# Patient Record
Sex: Male | Born: 1970 | Race: Black or African American | Hispanic: No | Smoking: Former smoker
Health system: Southern US, Community
[De-identification: ages and names within clinical notes are randomized; demographics above are authoritative.]

## PROBLEM LIST (undated history)

## (undated) HISTORY — PX: VASECTOMY: SHX75

---

## 2004-10-09 ENCOUNTER — Emergency Department (HOSPITAL_COMMUNITY): Admission: EM | Admit: 2004-10-09 | Discharge: 2004-10-09 | Payer: Self-pay | Admitting: Emergency Medicine

## 2005-02-21 ENCOUNTER — Ambulatory Visit (HOSPITAL_COMMUNITY): Admission: RE | Admit: 2005-02-21 | Discharge: 2005-02-21 | Payer: Self-pay | Admitting: Family Medicine

## 2005-02-28 ENCOUNTER — Ambulatory Visit: Payer: Self-pay | Admitting: Orthopedic Surgery

## 2005-03-13 ENCOUNTER — Encounter: Admission: RE | Admit: 2005-03-13 | Discharge: 2005-03-13 | Payer: Self-pay | Admitting: Orthopedic Surgery

## 2005-03-27 ENCOUNTER — Encounter: Admission: RE | Admit: 2005-03-27 | Discharge: 2005-03-27 | Payer: Self-pay | Admitting: Orthopedic Surgery

## 2005-05-24 ENCOUNTER — Encounter: Admission: RE | Admit: 2005-05-24 | Discharge: 2005-05-24 | Payer: Self-pay | Admitting: Orthopedic Surgery

## 2005-07-17 ENCOUNTER — Encounter: Admission: RE | Admit: 2005-07-17 | Discharge: 2005-07-17 | Payer: Self-pay | Admitting: Orthopedic Surgery

## 2006-05-21 ENCOUNTER — Emergency Department (HOSPITAL_COMMUNITY): Admission: EM | Admit: 2006-05-21 | Discharge: 2006-05-21 | Payer: Self-pay | Admitting: Emergency Medicine

## 2006-11-18 ENCOUNTER — Emergency Department (HOSPITAL_COMMUNITY): Admission: EM | Admit: 2006-11-18 | Discharge: 2006-11-18 | Payer: Self-pay | Admitting: Emergency Medicine

## 2012-02-27 ENCOUNTER — Encounter (HOSPITAL_COMMUNITY): Payer: Self-pay | Admitting: Emergency Medicine

## 2012-02-27 ENCOUNTER — Emergency Department (HOSPITAL_COMMUNITY): Payer: Managed Care, Other (non HMO)

## 2012-02-27 ENCOUNTER — Emergency Department (HOSPITAL_COMMUNITY)
Admission: EM | Admit: 2012-02-27 | Discharge: 2012-02-27 | Disposition: A | Discharge status: Home / Self Care | Payer: Managed Care, Other (non HMO) | Attending: Emergency Medicine | Admitting: Emergency Medicine

## 2012-02-27 DIAGNOSIS — Z87891 Personal history of nicotine dependence: Secondary | ICD-10-CM | POA: Insufficient documentation

## 2012-02-27 DIAGNOSIS — S91114A Laceration without foreign body of right lesser toe(s) without damage to nail, initial encounter: Secondary | ICD-10-CM

## 2012-02-27 DIAGNOSIS — W208XXA Other cause of strike by thrown, projected or falling object, initial encounter: Secondary | ICD-10-CM | POA: Insufficient documentation

## 2012-02-27 DIAGNOSIS — Y92009 Unspecified place in unspecified non-institutional (private) residence as the place of occurrence of the external cause: Secondary | ICD-10-CM | POA: Insufficient documentation

## 2012-02-27 DIAGNOSIS — S92919A Unspecified fracture of unspecified toe(s), initial encounter for closed fracture: Secondary | ICD-10-CM | POA: Insufficient documentation

## 2012-02-27 DIAGNOSIS — S97101A Crushing injury of unspecified right toe(s), initial encounter: Secondary | ICD-10-CM

## 2012-02-27 DIAGNOSIS — Z23 Encounter for immunization: Secondary | ICD-10-CM | POA: Insufficient documentation

## 2012-02-27 MED ORDER — DEXTROSE 5 % IV SOLN
2.0000 g | Freq: Once | INTRAVENOUS | Status: DC
  Filled 2012-02-27: qty 2

## 2012-02-27 MED ORDER — FENTANYL CITRATE 0.05 MG/ML IJ SOLN
50.0000 ug | Freq: Once | INTRAMUSCULAR | Status: AC
  Administered 2012-02-27: 14:00:00 via INTRAMUSCULAR
  Filled 2012-02-27: qty 2

## 2012-02-27 MED ORDER — TETANUS-DIPHTH-ACELL PERTUSSIS 5-2.5-18.5 LF-MCG/0.5 IM SUSP
0.5000 mL | Freq: Once | INTRAMUSCULAR | Status: AC
  Administered 2012-02-27: 0.5 mL via INTRAMUSCULAR
  Filled 2012-02-27: qty 0.5

## 2012-02-27 MED ORDER — OXYCODONE-ACETAMINOPHEN 5-325 MG PO TABS: ORAL_TABLET | ORAL | Status: DC

## 2012-02-27 MED ORDER — ONDANSETRON HCL 4 MG PO TABS
4.0000 mg | ORAL_TABLET | Freq: Once | ORAL | Status: AC
  Administered 2012-02-27: 4 mg via ORAL
  Filled 2012-02-27: qty 1

## 2012-02-27 MED ORDER — CEFTAZIDIME 1 G IJ SOLR
1.0000 g | Freq: Once | INTRAMUSCULAR | Status: AC
  Administered 2012-02-27: 1 g via INTRAMUSCULAR
  Filled 2012-02-27: qty 1

## 2012-02-27 NOTE — ED Notes (Signed)
Patient with c/o toe injury. Right great and second toe injury due to dropped floor jack on foot. Ambulatory with limp. Bleeding controlled.

## 2012-02-27 NOTE — ED Notes (Signed)
Pt presents with rt great toe and second toe lacerations and noted swelling after dropping a floor jack on foot.  Bleeding controlled. Xray completed and resulted positive for fracture. EDP aware. + pulses proximal to crush injury. Cap refill brisk.

## 2012-02-27 NOTE — ED Provider Notes (Signed)
History     CSN: [id number]  Arrival date & time 02/27/12  1056   First MD Initiated Contact with Patient 02/27/12 1334      Chief Complaint  Patient presents with  . Toe Injury    (Consider location/radiation/quality/duration/timing/severity/associated sxs/prior treatment) HPI Comments: Patient is a 41 year old gentleman who dropped a floor jack on the right foot earlier today. She he has bleeding and swelling and pain involving the first and second toes of the right foot. He attempted to wrap it up to stop the bleeding but the bleeding and pain became more than he could stand and so he came to the emergency department for evaluation. He is unsure of the date of his last tetanus. He denies being on any blood thinning type medications or having a bleeding type disorders. He has not had any previous operations or procedures on the right foot. He has not taken anything for pain up to this point. The patient was at home when this accident happened.  The history is provided by the patient.    History reviewed. No pertinent past medical history.  Past Surgical History  Procedure Date  . Vasectomy     No family history on file.  History  Substance Use Topics  . Smoking status: Former Games developer  . Smokeless tobacco: Not on file  . Alcohol Use: No      Review of Systems  Constitutional: Negative for activity change.       All ROS Neg except as noted in HPI  HENT: Negative for nosebleeds and neck pain.   Eyes: Negative for photophobia and discharge.  Respiratory: Negative for cough, shortness of breath and wheezing.   Cardiovascular: Negative for chest pain and palpitations.  Gastrointestinal: Negative for abdominal pain and blood in stool.  Genitourinary: Negative for dysuria, frequency and hematuria.  Musculoskeletal: Negative for back pain and arthralgias.  Skin: Negative.   Neurological: Negative for dizziness, seizures and speech difficulty.  Psychiatric/Behavioral:  Negative for hallucinations and confusion.    Allergies  Review of patient's allergies indicates no known allergies.  Home Medications  No current outpatient prescriptions on file.  BP 154/85  Pulse 59  Temp 98.5 F (36.9 C) (Oral)  Resp 18  Wt 272 lb (123.378 kg)  SpO2 100%  Physical Exam  Nursing note and vitals reviewed. Constitutional: He is oriented to person, place, and time. He appears well-developed and well-nourished.  Non-toxic appearance.  HENT:  Head: Normocephalic.  Right Ear: Tympanic membrane and external ear normal.  Left Ear: Tympanic membrane and external ear normal.  Eyes: EOM and lids are normal. Pupils are equal, round, and reactive to light.  Neck: Normal range of motion. Neck supple. Carotid bruit is not present.  Cardiovascular: Normal rate, regular rhythm, normal heart sounds, intact distal pulses and normal pulses.   Pulmonary/Chest: Breath sounds normal. No respiratory distress.  Abdominal: Soft. Bowel sounds are normal. There is no tenderness. There is no guarding.  Musculoskeletal: Normal range of motion.       There is full range of motion of the right ankle. There is no deformity of the right tib-fib area. There is good movement of all the toes. There is a laceration to the plantar surface of the distal second toe. With some active bleeding. There is pain and swelling of the right first toe. There is a laceration behind the nail of the first toe. The dorsalis pedis pulse is 2+ and symmetrical.  Lymphadenopathy:  Head (right side): No submandibular adenopathy present.       Head (left side): No submandibular adenopathy present.    He has no cervical adenopathy.  Neurological: He is alert and oriented to person, place, and time. He has normal strength. No cranial nerve deficit or sensory deficit.  Skin: Skin is warm and dry.  Psychiatric: He has a normal mood and affect. His speech is normal.    ED Course  Procedures : LACERATION REPAIR OF  THE RIGHT 2ND TOE - patient identified by arm band. Permission for procedure given by the patient. Procedural time out taken before repair of laceration to the right second toe. The toe was painted with Betadine. The right toe was then infiltrated with 0.25% bupivacaine. The toe was then again painted with Betadine and after adequate anesthesia was accomplished. The flap laceration with avulsion to the plantar surface of the right second toe was irrigated and evaluated. There was no foreign body appreciated. There was no bone or tendon involvement. There was some mild debridement done for this area. The wound was then closed with 4 interrupted sutures of 4-0 nylon. A sterile dressing was applied. The patient was fitted with a postop shoe. Patient's tetanus was updated, patient tolerated the procedure without any problem, or complication.   Labs Reviewed - No data to display Dg Foot Complete Right  02/27/2012  *RADIOLOGY REPORT*  Clinical Data: Right foot pain and lacerations, dropped floor jack on foot  RIGHT FOOT COMPLETE - 3+ VIEW  Comparison: None  Findings: Comminuted tuft fracture distal phalanx right great toe. No definite articular extension to the IP joint. Osseous mineralization normal. Joint spaces preserved. No additional fracture or dislocation identified. Mild soft tissue swelling right great toe and foot.  IMPRESSION: Comminuted tuft fracture distal phalanx right great toe.   Original Report Authenticated By: Lollie Marrow, M.D.      No diagnosis found.    MDM  I have reviewed nursing notes, vital signs, and all appropriate lab and imaging results for this patient. Patient dropped a floor jack on the right foot. He sustained a laceration to the plantar surface of the right second toe. The x-ray of the foot reveals a comminuted tuft fracture of the distal tuft of the right first toe. This was discussed with Dr. Romeo Apple. He requests to see the patient in the office on tomorrow at 8:30 in  the morning. The patient's tetanus was updated. The wound to the right second toe was repaired. Per Dr. Mort Sawyers request the laceration to the first toe was cleansed, dressing applied, antibiotic given Elita Quick), and postop shoe fitted for the patient. Prescription for Percocet one every 4-6 hours given to the patient. The patient is to see the orthopedic specialist at 8:30 tomorrow morning. The patient has been given instructions to keep the foot clean and dry. He's been given instructions to keep the foot iced and elevated.       Kathie Dike, [place] 02/27/12 [phone]

## 2012-02-27 NOTE — ED Notes (Signed)
Wound cleansed and betadine applied. Distal foot wrapped with cling awaiting ortho consult.   Laceration noted to second toe and to great toe at base of the toenail. Pt tolerated cleaning well.

## 2012-02-28 ENCOUNTER — Ambulatory Visit (INDEPENDENT_AMBULATORY_CARE_PROVIDER_SITE_OTHER): Payer: Managed Care, Other (non HMO) | Admitting: Orthopedic Surgery

## 2012-02-28 ENCOUNTER — Encounter: Payer: Self-pay | Admitting: Orthopedic Surgery

## 2012-02-28 VITALS — BP 130/82 | Ht 73.0 in | Wt 272.0 lb

## 2012-02-28 DIAGNOSIS — S92401B Displaced unspecified fracture of right great toe, initial encounter for open fracture: Secondary | ICD-10-CM

## 2012-02-28 DIAGNOSIS — S92919B Unspecified fracture of unspecified toe(s), initial encounter for open fracture: Secondary | ICD-10-CM

## 2012-02-28 MED ORDER — OXYCODONE-ACETAMINOPHEN 5-325 MG PO TABS: 1.0000 | ORAL_TABLET | ORAL | Status: DC | PRN

## 2012-02-28 MED ORDER — CEPHALEXIN 500 MG PO CAPS: 500.0000 mg | ORAL_CAPSULE | Freq: Four times a day (QID) | ORAL | Status: AC

## 2012-02-28 NOTE — Patient Instructions (Addendum)
Work note: oow x 2 weeks   KEEP DRESSING ON X 1 WEEK

## 2012-02-28 NOTE — ED Provider Notes (Signed)
Medical screening examination/treatment/procedure(s) were performed by non-physician practitioner and as supervising physician I was immediately available for consultation/collaboration.   Verley Pariseau L Kyriakos Babler, MD 02/28/12 0735 

## 2012-02-28 NOTE — Progress Notes (Signed)
Patient ID: Dennis Foster, male   DOB: 04-11-1971, 41 y.o.   MRN: [id number] Chief complaint fracture right great toe  On 92 this 41 year old male dropped a jack on his right foot sustained a laceration over the dorsum of the right great toe and an underlying distal tuft fracture as well as a laceration on the plantar aspect of the second digit which was treated with suturing in the emergency room. Complains a 4/10 throbbing pain which is intermittent there is some swelling no numbness no tingling  He's been comfortable with Percocet  He had an IM shot of third generation cephalosporin  His review of systems is negative.  He is healthy. He is a Merchandiser, retail for shipping and receiving  History reviewed. No pertinent past medical history.  Past Surgical History  Procedure Date  . Vasectomy     Family History  Problem Relation Age of Onset  . Diabetes     History   Social History  . Marital Status: Legally Separated    Spouse Name: N/A    Number of Children: N/A  . Years of Education: 12   Occupational History  .     Social History Main Topics  . Smoking status: Former Games developer  . Smokeless tobacco: Not on file  . Alcohol Use: No  . Drug Use: No  . Sexually Active:    Other Topics Concern  . Not on file   Social History Narrative  . No narrative on file   BP 130/82  Ht 6\' 1"  (1.854 m)  Wt 272 lb (123.378 kg)  BMI 35.89 kg/m2 Gen. exam is normal. He is oriented x3. Mood and affect normal. Gait and station are remarkable for postop shoe a slight limp  He has a laceration clean over the dorsum of the great toe the plantar aspect of the second digit has a route for sutures from what I can tell. No signs of infection. Stability test deferred. Muscle tone normal. Skin as described. Color capillary refill pulse and temperature normal. Normal sensation.  Mild swelling of the foot  Impression x-rays show comminuted fracture of the tuft great toe right foot  Impression #1  open fracture great toe distal phalanx Impression laceration second digit right foot  I redressed the wound. We'll take the stitches out in a week. Keflex 10 days. Out of work 2 weeks.

## 2012-03-05 ENCOUNTER — Ambulatory Visit (INDEPENDENT_AMBULATORY_CARE_PROVIDER_SITE_OTHER): Payer: Managed Care, Other (non HMO) | Admitting: Orthopedic Surgery

## 2012-03-05 ENCOUNTER — Encounter: Payer: Self-pay | Admitting: Orthopedic Surgery

## 2012-03-05 VITALS — BP 120/80 | Ht 73.0 in | Wt 272.0 lb

## 2012-03-05 DIAGNOSIS — S92919B Unspecified fracture of unspecified toe(s), initial encounter for open fracture: Secondary | ICD-10-CM

## 2012-03-05 DIAGNOSIS — S92401B Displaced unspecified fracture of right great toe, initial encounter for open fracture: Secondary | ICD-10-CM

## 2012-03-05 NOTE — Patient Instructions (Addendum)
SOAK FOOT DAILY 20 MINUTES WITH 1 DROP OF DISH DETERGENT  APPLY BAND AID   THERE IS 1 MORE SUTURE LEFT WE'LL HAVE TO GET OUT.

## 2012-03-05 NOTE — Progress Notes (Signed)
Patient ID: Dennis Foster, male   DOB: 04-19-1971, 41 y.o.   MRN: [id number] Chief Complaint  Patient presents with  . Wound Check    right foot great toe wound check and suture removal, DOI 02/27/12    Crush injury right foot laceration plan I expect second digit right foot  Sutures removed one stitch we could not get out its buried we tried.  Recommend soaking the toe to get the swelling to go down and the scab to slough come back next week check again to try to get the suture out.

## 2012-03-12 ENCOUNTER — Encounter: Payer: Self-pay | Admitting: Orthopedic Surgery

## 2012-03-12 ENCOUNTER — Ambulatory Visit (INDEPENDENT_AMBULATORY_CARE_PROVIDER_SITE_OTHER): Payer: Managed Care, Other (non HMO) | Admitting: Orthopedic Surgery

## 2012-03-12 VITALS — BP 120/76 | Ht 73.0 in | Wt 272.0 lb

## 2012-03-12 DIAGNOSIS — S92919B Unspecified fracture of unspecified toe(s), initial encounter for open fracture: Secondary | ICD-10-CM

## 2012-03-12 DIAGNOSIS — S92401B Displaced unspecified fracture of right great toe, initial encounter for open fracture: Secondary | ICD-10-CM

## 2012-03-12 NOTE — Patient Instructions (Signed)
activities as tolerated 

## 2012-03-12 NOTE — Progress Notes (Signed)
Patient ID: Dennis Foster, male   DOB: 02/09/1971, 41 y.o.   MRN: [id number] Chief Complaint  Patient presents with  . Follow-up    Recheck right great toe with suture removal.    Laceration plantar aspect second digit right foot  Nailbed laceration fracture distal phalanx right great toe  The suture was removed by the patient in the office today. The plantar laceration is almost completely healed  Follow up as needed resume activities as tolerated.

## 2014-01-01 IMAGING — CR DG FOOT COMPLETE 3+V*R*
3 series · 3 of 3 positions shown · non-contrast
Comparison: None

CLINICAL DATA: Right foot pain and lacerations, dropped floor [place]
on foot

RIGHT FOOT COMPLETE - 3+ VIEW

[view not recorded (1 of 3)]
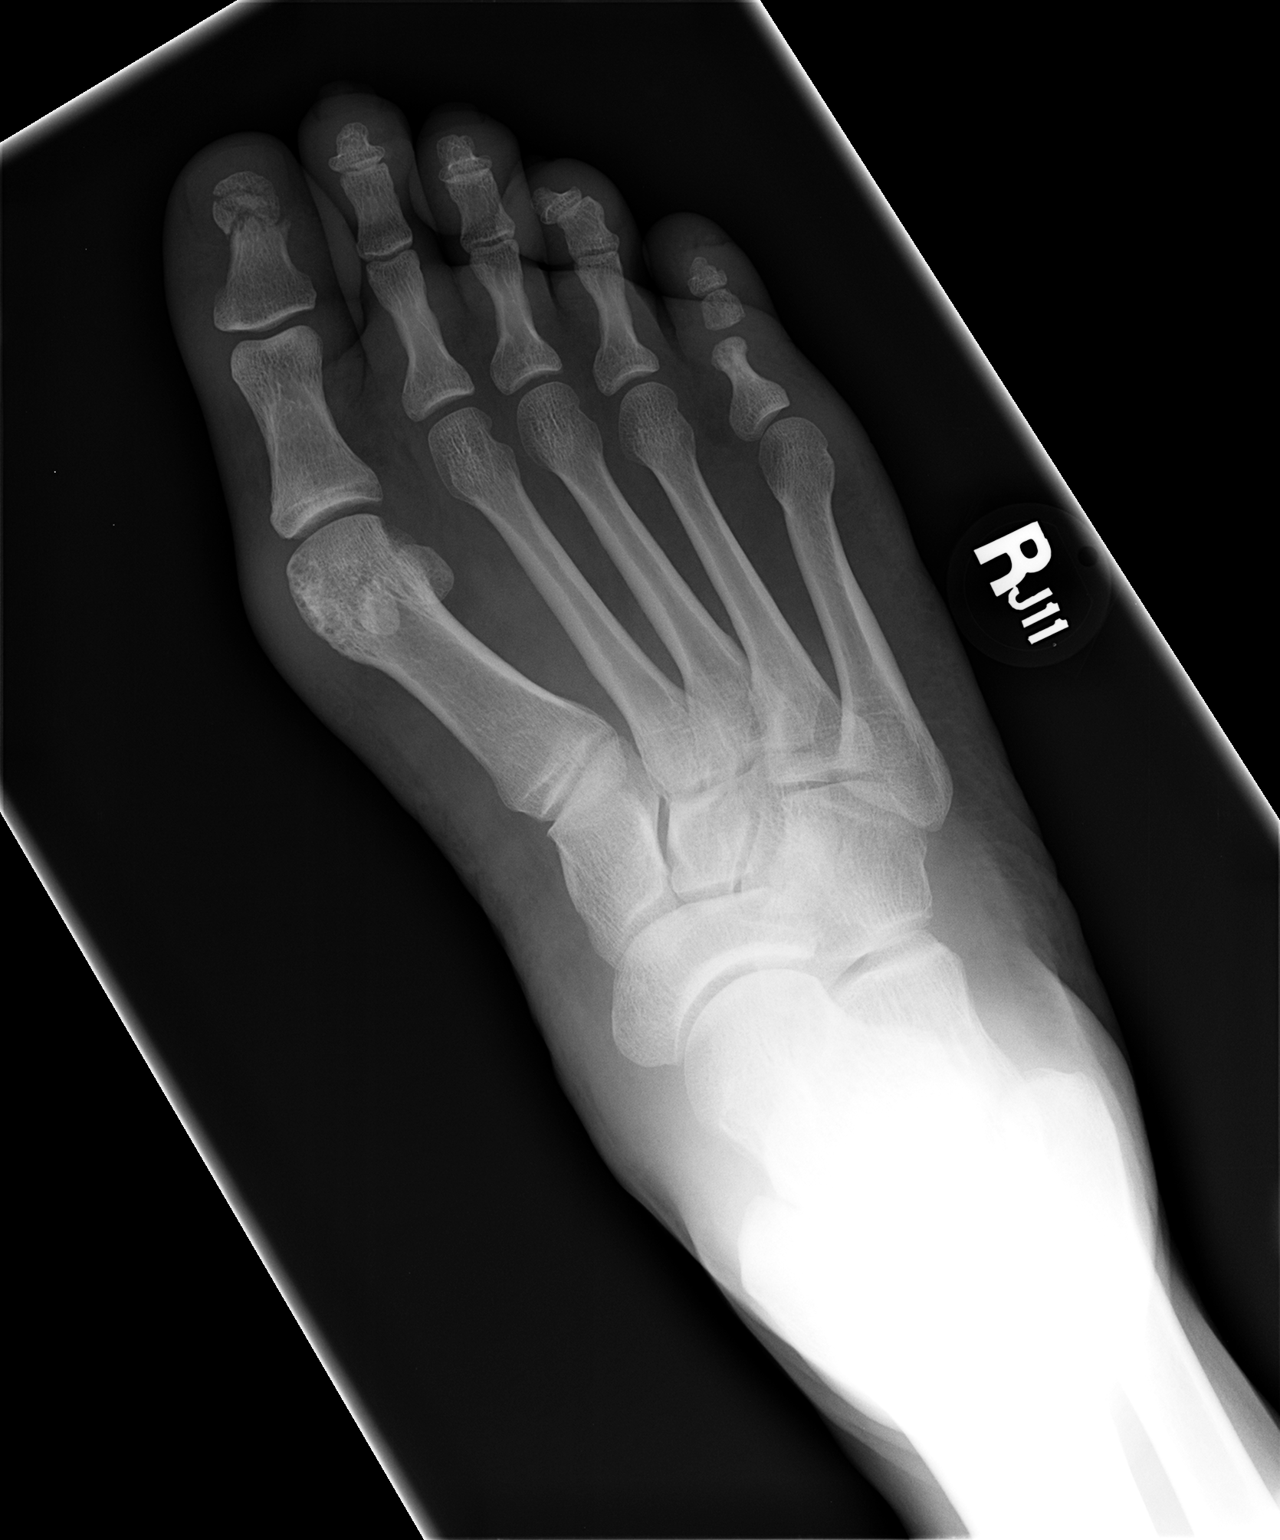

[view not recorded (2 of 3)]
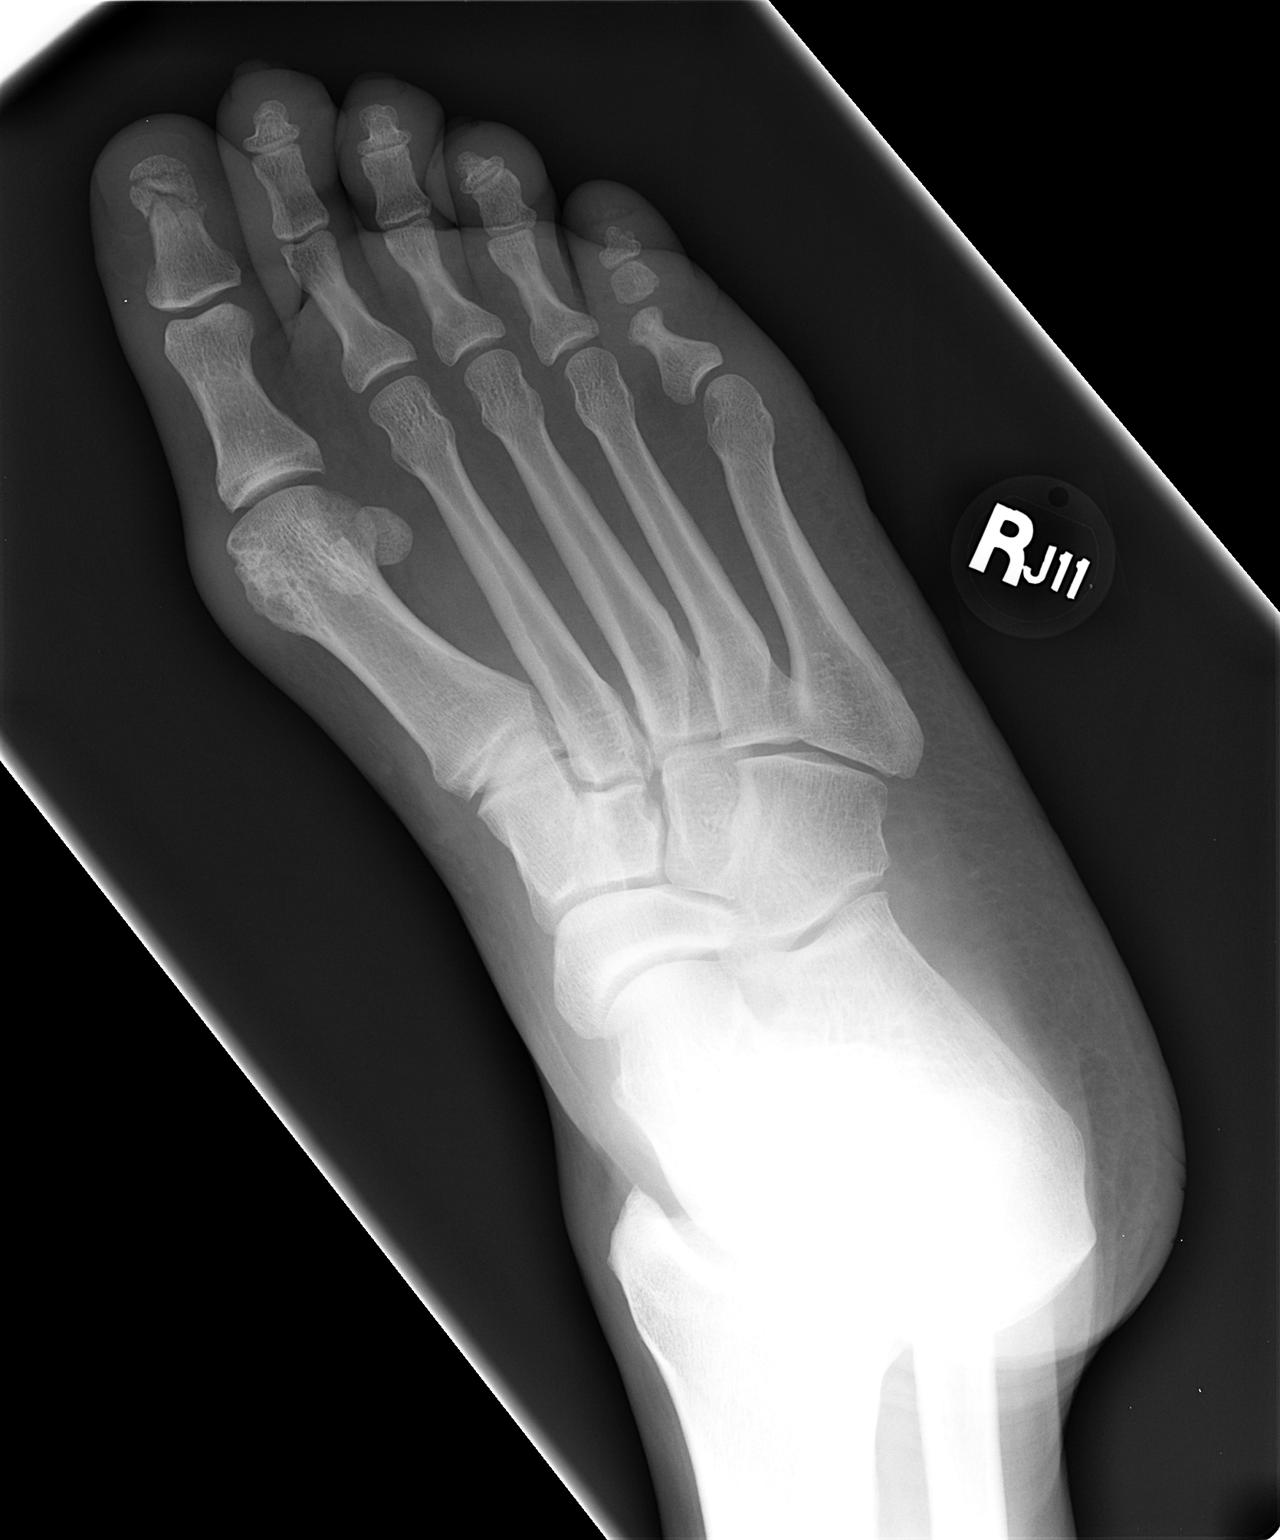

[view not recorded (3 of 3)]
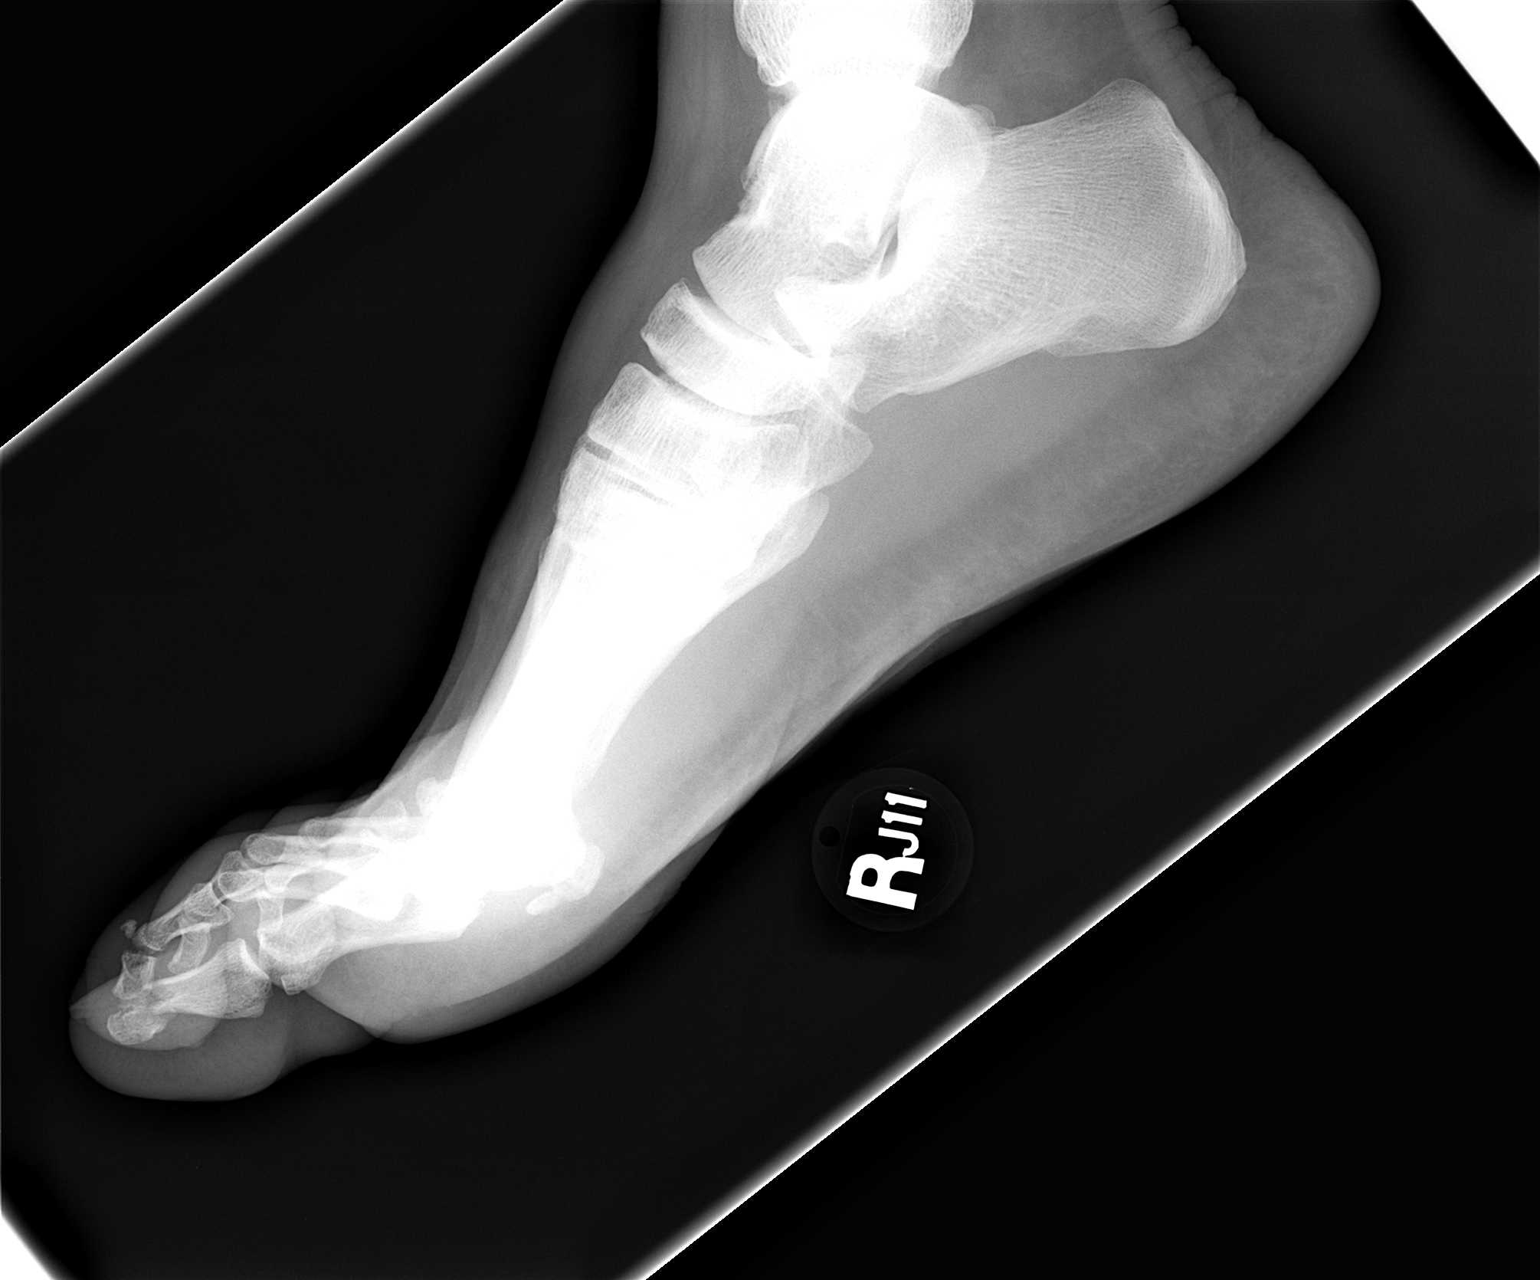

[3 of 3 positions shown; findings below may reference images not displayed]

FINDINGS: Comminuted tuft fracture distal phalanx right great toe.
No definite articular extension to the IP joint.
Osseous mineralization normal.
Joint spaces preserved.
No additional fracture or dislocation identified.
Mild soft tissue swelling right great toe and foot.
IMPRESSION: Comminuted tuft fracture distal phalanx right great toe.

## 2019-03-17 ENCOUNTER — Other Ambulatory Visit: Payer: Self-pay | Admitting: *Deleted

## 2019-03-17 DIAGNOSIS — Z20822 Contact with and (suspected) exposure to covid-19: Secondary | ICD-10-CM

## 2019-03-19 LAB — NOVEL CORONAVIRUS, NAA: SARS-CoV-2, NAA: NOT DETECTED

## 2019-05-11 ENCOUNTER — Other Ambulatory Visit: Payer: Self-pay | Admitting: Otolaryngology

## 2019-05-11 DIAGNOSIS — IMO0001 Reserved for inherently not codable concepts without codable children: Secondary | ICD-10-CM

## 2019-05-11 DIAGNOSIS — H918X2 Other specified hearing loss, left ear: Secondary | ICD-10-CM

## 2019-05-11 DIAGNOSIS — H9312 Tinnitus, left ear: Secondary | ICD-10-CM

## 2019-08-03 ENCOUNTER — Encounter: Payer: Self-pay | Admitting: *Deleted

## 2019-10-06 ENCOUNTER — Other Ambulatory Visit: Payer: Self-pay

## 2019-10-06 ENCOUNTER — Ambulatory Visit (INDEPENDENT_AMBULATORY_CARE_PROVIDER_SITE_OTHER): Payer: Self-pay | Admitting: *Deleted

## 2019-10-06 DIAGNOSIS — Z1211 Encounter for screening for malignant neoplasm of colon: Secondary | ICD-10-CM

## 2019-10-06 MED ORDER — PEG 3350-KCL-NA BICARB-NACL 420 G PO SOLR: 4000.0000 mL | Freq: Once | ORAL | 0 refills | Status: AC

## 2019-10-06 NOTE — Progress Notes (Signed)
Gastroenterology Pre-Procedure Review  Request Date: 10/06/2019 Requesting Physician: Georgiann Cocker, NP @ [facility], no previous TCS  PATIENT REVIEW QUESTIONS: The patient responded to the following health history questions as indicated:    1. Diabetes Melitis: no 2. Joint replacements in the past 12 months: no 3. Major health problems in the past 3 months: no 4. Has an artificial valve or MVP: no 5. Has a defibrillator: no 6. Has been advised in past to take antibiotics in advance of a procedure like teeth cleaning: no 7. Family history of colon cancer: no  8. Alcohol Use: no 9. Illicit drug Use: no 10. History of sleep apnea: no  11. History of coronary artery or other vascular stents placed within the last 12 months: no 12. History of any prior anesthesia complications: no 13. There is no height or weight on file to calculate BMI. ht: 6'2 wt: 265 lbs    MEDICATIONS & ALLERGIES:    Patient reports the following regarding taking any blood thinners:   Plavix? no Aspirin? no Coumadin? no Brilinta? no Xarelto? no Eliquis? no Pradaxa? no Savaysa? no Effient? no  Patient confirms/reports the following medications:  No current outpatient medications on file.   No current facility-administered medications for this visit.    Patient confirms/reports the following allergies:  No Known Allergies  No orders of the defined types were placed in this encounter.   AUTHORIZATION INFORMATION Primary Insurance: Lucile Shutters #:[id number],  Group #: [id number] Pre-Cert / Berkley Harvey required: No, not required  SCHEDULE INFORMATION: Procedure has been scheduled as follows:  Date: 11/18/2019, Time: 1:00  Location: [facility] with Dr. Jena Gauss  This Gastroenterology Pre-Precedure Review Form is being routed to the following provider(s): Wynne Dust, NP

## 2019-10-06 NOTE — Progress Notes (Signed)
Ok to schedule.

## 2019-10-06 NOTE — Patient Instructions (Addendum)
Dennis Foster   [birth year] MRN: [id number]    Procedure Date: 11/18/2019 Time to register: 12:00 pm Place to register: [facility] Procedure Time: 1:00 pm Scheduled provider: Dr. Gala Romney  REMEMBER COVID TEST ON 11/16/2019 AT 3:20 PM.  PREPARATION FOR COLONOSCOPY WITH TRI-LYTE SPLIT PREP  Please notify us immediately if you are diabetic, take iron supplements, or if you are on Coumadin or any other blood thinners.   Please hold the following medications: n/a  You will need to purchase 1 fleet enema and 1 box of Bisacodyl '5mg'$  tablets.   2 DAYS BEFORE PROCEDURE:  DATE: 11/16/2019   DAY: Monday Begin clear liquid diet AFTER your lunch meal. NO SOLID FOODS after this point.  1 DAY BEFORE PROCEDURE:  DATE: 11/17/2019   DAY: Tuesday Continue clear liquids the entire day - NO SOLID FOOD.   Diabetic medications adjustments for today: n/a  At 2:00 pm:  Take 2 Bisacodyl tablets.   At 4:00pm:  Start drinking your solution. Make sure you mix well per instructions on the bottle. Try to drink 1 (one) 8 ounce glass every 10-15 minutes until you have consumed HALF the jug. You should complete by 6:00pm.You must keep the left over solution refrigerated until completed next day.  Continue clear liquids. You must drink plenty of clear liquids to prevent dehyration and kidney failure.     DAY OF PROCEDURE:   DATE: 11/18/2019   DAY: Wednesday If you take medications for your heart, blood pressure or breathing, you may take these medications.  Diabetic medications adjustments for today: n/a  Five hours before your procedure time @ 8:00 am:  Finish remaining amout of bowel prep, drinking 1 (one) 8 ounce glass every 10-15 minutes until complete. You have two hours to consume remaining prep.   Three hours before your procedure time @ 10:00 am:  Nothing by mouth.   At least one hour before going to the hospital:  Give yourself one Fleet enema. You may take your morning medications with sip of water  unless we have instructed otherwise.      Please see below for Dietary Information.  CLEAR LIQUIDS INCLUDE:  Water Jello (NOT red in color)   Ice Popsicles (NOT red in color)   Tea (sugar ok, no milk/cream) Powdered fruit flavored drinks  Coffee (sugar ok, no milk/cream) Gatorade/ Lemonade/ Kool-Aid  (NOT red in color)   Juice: apple, white grape, white cranberry Soft drinks  Clear bullion, consomme, broth (fat free beef/chicken/vegetable)  Carbonated beverages (any kind)  Strained chicken noodle soup Hard Candy   Remember: Clear liquids are liquids that will allow you to see your fingers on the other side of a clear glass. Be sure liquids are NOT red in color, and not cloudy, but CLEAR.  DO NOT EAT OR DRINK ANY OF THE FOLLOWING:  Dairy products of any kind   Cranberry juice Tomato juice / V8 juice   Grapefruit juice Orange juice     Red grape juice  Do not eat any solid foods, including such foods as: cereal, oatmeal, yogurt, fruits, vegetables, creamed soups, eggs, bread, crackers, pureed foods in a blender, etc.   HELPFUL HINTS FOR DRINKING PREP SOLUTION:   Make sure prep is extremely cold. Mix and refrigerate the the morning of the prep. You may also put in the freezer.   You may try mixing some Crystal Light or Country Time Lemonade if you prefer. Mix in small amounts; add more if necessary.  Try drinking  through a straw  Rinse mouth with water or a mouthwash between glasses, to remove after-taste.  Try sipping on a cold beverage /ice/ popsicles between glasses of prep.  Place a piece of sugar-free hard candy in mouth between glasses.  If you become nauseated, try consuming smaller amounts, or stretch out the time between glasses. Stop for 30-60 minutes, then slowly start back drinking.        OTHER INSTRUCTIONS  You will need a responsible adult at least 50 years of age to accompany you and drive you home. This person must remain in the waiting room during your  procedure. The hospital will cancel your procedure if you do not have a responsible adult with you.   1. Wear loose fitting clothing that is easily removed. 2. Leave jewelry and other valuables at home.  3. Remove all body piercing jewelry and leave at home. 4. Total time from sign-in until discharge is approximately 2-3 hours. 5. You should go home directly after your procedure and rest. You can resume normal activities the day after your procedure. 6. The day of your procedure you should not:  Drive  Make legal decisions  Operate machinery  Drink alcohol  Return to work   You may call the office (Dept: [phone]) before 5:00pm, or page the doctor on call [phone]) after 5:00pm, for further instructions, if necessary.   Insurance Information YOU WILL NEED TO CHECK WITH YOUR INSURANCE COMPANY FOR THE BENEFITS OF COVERAGE YOU HAVE FOR THIS PROCEDURE.  UNFORTUNATELY, NOT ALL INSURANCE COMPANIES HAVE BENEFITS TO COVER ALL OR PART OF THESE TYPES OF PROCEDURES.  IT IS YOUR RESPONSIBILITY TO CHECK YOUR BENEFITS, HOWEVER, WE WILL BE GLAD TO ASSIST YOU WITH ANY CODES YOUR INSURANCE COMPANY MAY NEED.    PLEASE NOTE THAT MOST INSURANCE COMPANIES WILL NOT COVER A SCREENING COLONOSCOPY FOR PEOPLE UNDER THE AGE OF 50  IF YOU HAVE BCBS INSURANCE, YOU MAY HAVE BENEFITS FOR A SCREENING COLONOSCOPY BUT IF POLYPS ARE FOUND THE DIAGNOSIS WILL CHANGE AND THEN YOU MAY HAVE A DEDUCTIBLE THAT WILL NEED TO BE MET. SO PLEASE MAKE SURE YOU CHECK YOUR BENEFITS FOR A SCREENING COLONOSCOPY AS WELL AS A DIAGNOSTIC COLONOSCOPY.

## 2019-11-16 ENCOUNTER — Other Ambulatory Visit: Payer: Self-pay

## 2019-11-16 ENCOUNTER — Other Ambulatory Visit (HOSPITAL_COMMUNITY)
Admission: RE | Admit: 2019-11-16 | Discharge: 2019-11-16 | Disposition: A | Discharge status: Home / Self Care | Payer: Managed Care, Other (non HMO) | Source: Ambulatory Visit | Attending: Internal Medicine | Admitting: Internal Medicine

## 2019-11-16 DIAGNOSIS — Z20822 Contact with and (suspected) exposure to covid-19: Secondary | ICD-10-CM | POA: Diagnosis not present

## 2019-11-16 DIAGNOSIS — Z01812 Encounter for preprocedural laboratory examination: Secondary | ICD-10-CM | POA: Insufficient documentation

## 2019-11-17 LAB — SARS CORONAVIRUS 2 (TAT 6-24 HRS): SARS Coronavirus 2: NEGATIVE

## 2019-11-18 ENCOUNTER — Other Ambulatory Visit: Payer: Self-pay

## 2019-11-18 ENCOUNTER — Ambulatory Visit (HOSPITAL_COMMUNITY)
Admission: RE | Admit: 2019-11-18 | Discharge: 2019-11-18 | Disposition: A | Discharge status: Home / Self Care | Payer: Managed Care, Other (non HMO) | Attending: Internal Medicine | Admitting: Internal Medicine

## 2019-11-18 ENCOUNTER — Encounter (HOSPITAL_COMMUNITY): Payer: Self-pay | Admitting: Internal Medicine

## 2019-11-18 ENCOUNTER — Encounter (HOSPITAL_COMMUNITY)
Admission: RE | Disposition: A | Discharge status: Home / Self Care | Payer: Self-pay | Source: Home / Self Care | Attending: Internal Medicine

## 2019-11-18 DIAGNOSIS — Z87891 Personal history of nicotine dependence: Secondary | ICD-10-CM | POA: Diagnosis not present

## 2019-11-18 DIAGNOSIS — K5730 Diverticulosis of large intestine without perforation or abscess without bleeding: Secondary | ICD-10-CM | POA: Diagnosis not present

## 2019-11-18 DIAGNOSIS — Z1211 Encounter for screening for malignant neoplasm of colon: Secondary | ICD-10-CM | POA: Diagnosis not present

## 2019-11-18 HISTORY — PX: COLONOSCOPY: SHX5424

## 2019-11-18 SURGERY — COLONOSCOPY
Anesthesia: Moderate Sedation

## 2019-11-18 MED ORDER — ONDANSETRON HCL 4 MG/2ML IJ SOLN
INTRAMUSCULAR | Status: AC
  Filled 2019-11-18: qty 2

## 2019-11-18 MED ORDER — MEPERIDINE HCL 50 MG/ML IJ SOLN
INTRAMUSCULAR | Status: AC
  Filled 2019-11-18: qty 1

## 2019-11-18 MED ORDER — ONDANSETRON HCL 4 MG/2ML IJ SOLN
INTRAMUSCULAR | Status: DC | PRN
  Administered 2019-11-18: 4 mg via INTRAVENOUS

## 2019-11-18 MED ORDER — STERILE WATER FOR IRRIGATION IR SOLN: Status: DC | PRN

## 2019-11-18 MED ORDER — MIDAZOLAM HCL 5 MG/5ML IJ SOLN
INTRAMUSCULAR | Status: AC
  Filled 2019-11-18: qty 10

## 2019-11-18 MED ORDER — SODIUM CHLORIDE 0.9 % IV SOLN: INTRAVENOUS | Status: DC

## 2019-11-18 MED ORDER — MIDAZOLAM HCL 5 MG/5ML IJ SOLN
INTRAMUSCULAR | Status: DC | PRN
  Administered 2019-11-18: 2 mg via INTRAVENOUS
  Administered 2019-11-18: 1 mg via INTRAVENOUS
  Administered 2019-11-18: 2 mg via INTRAVENOUS
  Administered 2019-11-18: 1 mg via INTRAVENOUS

## 2019-11-18 MED ORDER — MEPERIDINE HCL 100 MG/ML IJ SOLN
INTRAMUSCULAR | Status: DC | PRN
  Administered 2019-11-18 (×2): 25 mg via INTRAVENOUS

## 2019-11-18 NOTE — Discharge Instructions (Signed)
Diverticulosis  Diverticulosis is a condition that develops when small pouches (diverticula) form in the wall of the large intestine (colon). The colon is where water is absorbed and stool (feces) is formed. The pouches form when the inside layer of the colon pushes through weak spots in the outer layers of the colon. You may have a few pouches or many of them. The pouches usually do not cause problems unless they become inflamed or infected. When this happens, the condition is called diverticulitis. What are the causes? The cause of this condition is not known. What increases the risk? The following factors may make you more likely to develop this condition:  Being older than age 18. Your risk for this condition increases with age. Diverticulosis is rare among people younger than age 47. By age 73, many people have it.  Eating a low-fiber diet.  Having frequent constipation.  Being overweight.  Not getting enough exercise.  Smoking.  Taking over-the-counter pain medicines, like aspirin and ibuprofen.  Having a family history of diverticulosis. What are the signs or symptoms? In most people, there are no symptoms of this condition. If you do have symptoms, they may include:  Bloating.  Cramps in the abdomen.  Constipation or diarrhea.  Pain in the lower left side of the abdomen. How is this diagnosed? Because diverticulosis usually has no symptoms, it is most often diagnosed during an exam for other colon problems. The condition may be diagnosed by:  Using a flexible scope to examine the colon (colonoscopy).  Taking an X-ray of the colon after dye has been put into the colon (barium enema).  Having a CT scan. How is this treated? You may not need treatment for this condition. Your health care provider may recommend treatment to prevent problems. You may need treatment if you have symptoms or if you previously had diverticulitis. Treatment may include:  Eating a high-fiber  diet.  Taking a fiber supplement.  Taking a live bacteria supplement (probiotic).  Taking medicine to relax your colon. Follow these instructions at home: Medicines  Take over-the-counter and prescription medicines only as told by your health care provider.  If told by your health care provider, take a fiber supplement or probiotic. Constipation prevention Your condition may cause constipation. To prevent or treat constipation, you may need to:  Drink enough fluid to keep your urine pale yellow.  Take over-the-counter or prescription medicines.  Eat foods that are high in fiber, such as beans, whole grains, and fresh fruits and vegetables.  Limit foods that are high in fat and processed sugars, such as fried or sweet foods.  General instructions  Try not to strain when you have a bowel movement.  Keep all follow-up visits as told by your health care provider. This is important. Contact a health care provider if you:  Have pain in your abdomen.  Have bloating.  Have cramps.  Have not had a bowel movement in 3 days. Get help right away if:  Your pain gets worse.  Your bloating becomes very bad.  You have a fever or chills, and your symptoms suddenly get worse.  You vomit.  You have bowel movements that are bloody or black.  You have bleeding from your rectum. Summary  Diverticulosis is a condition that develops when small pouches (diverticula) form in the wall of the large intestine (colon).  You may have a few pouches or many of them.  This condition is most often diagnosed during an exam for other colon  problems.  Treatment may include increasing the fiber in your diet, taking supplements, or taking medicines. This information is not intended to replace advice given to you by your health care provider. Make sure you discuss any questions you have with your health care provider. Document Revised: 12/25/2018 Document Reviewed: 12/25/2018 Elsevier Patient  Education  2020 [employer]  Colonoscopy Discharge Instructions  Read the instructions outlined below and refer to this sheet in the next few weeks. These discharge instructions provide you with general information on caring for yourself after you leave the hospital. Your doctor may also give you specific instructions. While your treatment has been planned according to the most current medical practices available, unavoidable complications occasionally occur. If you have any problems or questions after discharge, call Dr. Jena Gauss at [phone]. ACTIVITY  You may resume your regular activity, but move at a slower pace for the next 24 hours.   Take frequent rest periods for the next 24 hours.   Walking will help get rid of the air and reduce the bloated feeling in your belly (abdomen).   No driving for 24 hours (because of the medicine (anesthesia) used during the test).    Do not sign any important legal documents or operate any machinery for 24 hours (because of the anesthesia used during the test).  NUTRITION  Drink plenty of fluids.   You may resume your normal diet as instructed by your doctor.   Begin with a light meal and progress to your normal diet. Heavy or fried foods are harder to digest and may make you feel sick to your stomach (nauseated).   Avoid alcoholic beverages for 24 hours or as instructed.  MEDICATIONS  You may resume your normal medications unless your doctor tells you otherwise.  WHAT YOU CAN EXPECT TODAY  Some feelings of bloating in the abdomen.   Passage of more gas than usual.   Spotting of blood in your stool or on the toilet paper.  IF YOU HAD POLYPS REMOVED DURING THE COLONOSCOPY:  No aspirin products for 7 days or as instructed.   No alcohol for 7 days or as instructed.   Eat a soft diet for the next 24 hours.  FINDING OUT THE RESULTS OF YOUR TEST Not all test results are available during your visit. If your test results are not back during  the visit, make an appointment with your caregiver to find out the results. Do not assume everything is normal if you have not heard from your caregiver or the medical facility. It is important for you to follow up on all of your test results.  SEEK IMMEDIATE MEDICAL ATTENTION IF:  You have more than a spotting of blood in your stool.   Your belly is swollen (abdominal distention).   You are nauseated or vomiting.   You have a temperature over 101.   You have abdominal pain or discomfort that is severe or gets worse throughout the day.    Diverticulosis information provided  Your colon looked good today.  No polyps.  I recommend a repeat colonoscopy in 10 years for screening purposes  At patient request, I called Chantel Char at [phone] and reviewed results

## 2019-11-18 NOTE — Op Note (Signed)
[facility] Patient Name: Dennis Foster Procedure Date: 11/18/2019 10:59 AM MRN: [id number] Date of Birth: Apr 07, 1971 Attending MD: Gennette Pac , MD CSN: [id number] Age: 49 Admit Type: Outpatient Procedure:                Colonoscopy Indications:              Screening for colorectal malignant neoplasm Providers:                Gennette Pac, MD, Tammy Vaught, RN, Crystal                            Page, Dyann Ruddle Referring MD:              Medicines:                Midazolam 6 mg IV, Meperidine 50 mg IV Complications:            No immediate complications. Estimated Blood Loss:     Estimated blood loss: none. Procedure:                Pre-Anesthesia Assessment:                           - Prior to the procedure, a History and Physical                            was performed, and patient medications and                            allergies were reviewed. The patient's tolerance of                            previous anesthesia was also reviewed. The risks                            and benefits of the procedure and the sedation                            options and risks were discussed with the patient.                            All questions were answered, and informed consent                            was obtained. Prior Anticoagulants: The patient has                            taken no previous anticoagulant or antiplatelet                            agents. ASA Grade Assessment: II - A patient with                            mild systemic disease. After reviewing the risks  and benefits, the patient was deemed in                            satisfactory condition to undergo the procedure.                           After obtaining informed consent, the colonoscope                            was passed under direct vision. Throughout the                            procedure, the patient's blood pressure, pulse, and                             oxygen saturations were monitored continuously. The                            CF-HQ190L ([id number]) scope was introduced through                            the anus and advanced to the the cecum, identified                            by appendiceal orifice and ileocecal valve. The                            colonoscopy was performed without difficulty. The                            patient tolerated the procedure well. The quality                            of the bowel preparation was adequate. Scope In: 11:58:50 AM Scope Out: 12:11:01 PM Scope Withdrawal Time: 0 hours 8 minutes 57 seconds  Total Procedure Duration: 0 hours 12 minutes 11 seconds  Findings:      The perianal and digital rectal examinations were normal.      Scattered medium-mouthed diverticula were found in the sigmoid colon,       descending colon and transverse colon.      The exam was otherwise without abnormality on direct and retroflexion       views. Impression:               - Diverticulosis in the sigmoid colon, in the                            descending colon and in the transverse colon.                           - The examination was otherwise normal on direct                            and retroflexion views.                           -  No specimens collected. Moderate Sedation:      Moderate (conscious) sedation was administered by the endoscopy nurse       and supervised by the endoscopist. The following parameters were       monitored: oxygen saturation, heart rate, blood pressure, respiratory       rate, EKG, adequacy of pulmonary ventilation, and response to care.       Total physician intraservice time was 18 minutes. Recommendation:           - Patient has a contact number available for                            emergencies. The signs and symptoms of potential                            delayed complications were discussed with the                            patient. Return to normal  activities tomorrow.                            Written discharge instructions were provided to the                            patient.                           - Resume previous diet.                           - Continue present medications.                           - Repeat colonoscopy in 10 years for screening                            purposes.                           - Return to GI office PRN. Procedure Code(s):        --- Professional ---                           [phone], Colonoscopy, flexible; diagnostic, including                            collection of specimen(s) by brushing or washing,                            when performed (separate procedure)                           G0500, Moderate sedation services provided by the                            same physician or other qualified health care  professional performing a gastrointestinal                            endoscopic service that sedation supports,                            requiring the presence of an independent trained                            observer to assist in the monitoring of the                            patient's level of consciousness and physiological                            status; initial 15 minutes of intra-service time;                            patient age 50 years or older (additional time may                            be reported with 96045, as appropriate) Diagnosis Code(s):        --- Professional ---                           Z12.11, Encounter for screening for malignant                            neoplasm of colon                           K57.30, Diverticulosis of large intestine without                            perforation or abscess without bleeding CPT copyright 2019 American Medical Association. All rights reserved. The codes documented in this report are preliminary and upon coder review may  be revised to meet current compliance  requirements. Gerrit Friends. Sharmaine Bain, MD Gennette Pac, MD 11/18/2019 12:18:33 PM This report has been signed electronically. Number of Addenda: 0

## 2019-11-18 NOTE — H&P (Signed)
@  ZOXW@   Primary Care Physician:  Mirna Mires, MD Primary Gastroenterologist:  Dr. Jena Gauss  Pre-Procedure History & Physical: HPI:  Dennis Foster is a 49 y.o. male is here for a screening colonoscopy.   History reviewed. No pertinent past medical history.  Past Surgical History:  Procedure Laterality Date  . VASECTOMY      Prior to Admission medications   Not on File    Allergies as of 10/07/2019  . (No Known Allergies)    Family History  Problem Relation Age of Onset  . Diabetes Other     Social History   Socioeconomic History  . Marital status: Legally Separated    Spouse name: Not on file  . Number of children: Not on file  . Years of education: 25  . Highest education level: Not on file  Occupational History    Employer: [employer]  Tobacco Use  . Smoking status: Former Games developer  . Smokeless tobacco: Never Used  . Tobacco comment: cigar every once in a while  Substance and Sexual Activity  . Alcohol use: No  . Drug use: No  . Sexual activity: Not on file  Other Topics Concern  . Not on file  Social History Narrative  . Not on file   Social Determinants of Health   Financial Resource Strain:   . Difficulty of Paying Living Expenses:   Food Insecurity:   . Worried About Programme researcher, broadcasting/film/video in the Last Year:   . Barista in the Last Year:   Transportation Needs:   . Freight forwarder (Medical):   Marland Kitchen Lack of Transportation (Non-Medical):   Physical Activity:   . Days of Exercise per Week:   . Minutes of Exercise per Session:   Stress:   . Feeling of Stress :   Social Connections:   . Frequency of Communication with Friends and Family:   . Frequency of Social Gatherings with Friends and Family:   . Attends Religious Services:   . Active Member of Clubs or Organizations:   . Attends Banker Meetings:   Marland Kitchen Marital Status:   Intimate Partner Violence:   . Fear of Current or Ex-Partner:   . Emotionally Abused:   Marland Kitchen Physically  Abused:   . Sexually Abused:     Review of Systems: See HPI, otherwise negative ROS  Physical Exam: BP (!) 139/94   Pulse 68   Temp 97.9 F (36.6 C) (Oral)   Resp 20   Ht 6\' 1"  (1.854 m)   Wt 122.5 kg   SpO2 100%   BMI 35.62 kg/m  General:   Alert,  Well-developed, well-nourished, pleasant and cooperative in NAD Lungs:  Clear throughout to auscultation.   No wheezes, crackles, or rhonchi. No acute distress. Heart:  Regular rate and rhythm; no murmurs, clicks, rubs,  or gallops. Abdomen:  Soft, nontender and nondistended. No masses, hepatosplenomegaly or hernias noted. Normal bowel sounds, without guarding, and without rebound.    Impression/Plan: Dennis Foster is now here to undergo a screening colonoscopy.  First ever average screening examination  Risks, benefits, limitations, imponderables and alternatives regarding colonoscopy have been reviewed with the patient. Questions have been answered. All parties agreeable.     Notice:  This dictation was prepared with Dragon dictation along with smaller phrase technology. Any transcriptional errors that result from this process are unintentional and may not be corrected upon review.

## 2019-11-23 ENCOUNTER — Encounter (HOSPITAL_COMMUNITY): Payer: Self-pay | Admitting: Internal Medicine

## 2019-12-19 ENCOUNTER — Other Ambulatory Visit: Payer: Self-pay

## 2019-12-19 ENCOUNTER — Ambulatory Visit
Admission: EM | Admit: 2019-12-19 | Discharge: 2019-12-19 | Disposition: A | Discharge status: Home / Self Care | Payer: Managed Care, Other (non HMO)

## 2019-12-19 ENCOUNTER — Encounter: Payer: Self-pay | Admitting: Emergency Medicine

## 2019-12-19 DIAGNOSIS — T148XXA Other injury of unspecified body region, initial encounter: Secondary | ICD-10-CM

## 2019-12-19 DIAGNOSIS — M79644 Pain in right finger(s): Secondary | ICD-10-CM

## 2019-12-19 NOTE — Discharge Instructions (Addendum)
Splinter removed Wash site daily with warm water and mild soap Keep covered Apply neosporin  Follow up with PCP as needed Return or go to the ED if you have any new or worsening symptoms increased redness, swelling, pain, nausea, vomiting, fever, chills, etc..Marland Kitchen

## 2019-12-19 NOTE — ED Triage Notes (Signed)
Splinter in right thumb.

## 2019-12-19 NOTE — ED Provider Notes (Signed)
[facility]   [id number] 12/19/19 Arrival Time: 0944   CC: Splinter   SUBJECTIVE:  Dennis Foster is a 49 y.o. male who presents with splinter to RT thumb x 1 day. Installing wood floors yesterday when piece of wood splintered off into RT thumb.  Tried removing at home without relief.  Tender to the touch.  Denies fever, chills, nausea, vomiting, redness, swelling, discharge.    ROS: As per HPI.  All other pertinent ROS negative.     History reviewed. No pertinent past medical history. Past Surgical History:  Procedure Laterality Date  . COLONOSCOPY N/A 11/18/2019   Procedure: COLONOSCOPY;  Surgeon: Corbin Ade, MD;  Location: [facility];  Service: Endoscopy;  Laterality: N/A;  1:00  . VASECTOMY     No Known Allergies No current facility-administered medications on file prior to encounter.   No current outpatient medications on file prior to encounter.   Social History   Socioeconomic History  . Marital status: Legally Separated    Spouse name: Not on file  . Number of children: Not on file  . Years of education: 84  . Highest education level: Not on file  Occupational History    Employer: [employer]  Tobacco Use  . Smoking status: Former Games developer  . Smokeless tobacco: Never Used  . Tobacco comment: cigar every once in a while  Substance and Sexual Activity  . Alcohol use: No  . Drug use: No  . Sexual activity: Not on file  Other Topics Concern  . Not on file  Social History Narrative  . Not on file   Social Determinants of Health   Financial Resource Strain:   . Difficulty of Paying Living Expenses:   Food Insecurity:   . Worried About Programme researcher, broadcasting/film/video in the Last Year:   . Barista in the Last Year:   Transportation Needs:   . Freight forwarder (Medical):   Marland Kitchen Lack of Transportation (Non-Medical):   Physical Activity:   . Days of Exercise per Week:   . Minutes of Exercise per Session:   Stress:   . Feeling of Stress :   Social  Connections:   . Frequency of Communication with Friends and Family:   . Frequency of Social Gatherings with Friends and Family:   . Attends Religious Services:   . Active Member of Clubs or Organizations:   . Attends Banker Meetings:   Marland Kitchen Marital Status:   Intimate Partner Violence:   . Fear of Current or Ex-Partner:   . Emotionally Abused:   Marland Kitchen Physically Abused:   . Sexually Abused:    Family History  Problem Relation Age of Onset  . Diabetes Other     OBJECTIVE:  Vitals:   12/19/19 0953  BP: (!) 141/105  Pulse: 72  Resp: 17  Temp: 98.1 F (36.7 C)  TempSrc: Oral  SpO2: 97%     General appearance: alert; no distress CV: radial pulse 2+ Skin: RT thumb with splinter to distal dorsal aspect; TTP, clear drainage, no bleeding Psychological: alert and cooperative; normal mood and affect  Procedure: Verbal consent obtained. Area over induration cleaned with betadine. Lidocaine 2% without epinephrine used to obtain local anesthesia. Splinter removed with forceps apx 0.25 cm in length.  Minimal bleeding. No complications.  ASSESSMENT & PLAN:  1. Splinter   2. Thumb pain, right    Splinter removed Wash site daily with warm water and mild soap Keep covered Apply neosporin  Follow up with PCP as needed Return or go to the ED if you have any new or worsening symptoms increased redness, swelling, pain, nausea, vomiting, fever, chills, etc...   Reviewed expectations re: course of current medical issues. Questions answered. Outlined signs and symptoms indicating need for more acute intervention. Patient verbalized understanding. After Visit Summary given.          Rennis Harding, PA-C 12/19/19 1102

## 2020-06-21 DIAGNOSIS — I10 Essential (primary) hypertension: Secondary | ICD-10-CM | POA: Diagnosis not present

## 2020-06-21 DIAGNOSIS — E782 Mixed hyperlipidemia: Secondary | ICD-10-CM | POA: Diagnosis not present

## 2020-06-21 DIAGNOSIS — E6609 Other obesity due to excess calories: Secondary | ICD-10-CM | POA: Diagnosis not present

## 2020-06-21 DIAGNOSIS — R519 Headache, unspecified: Secondary | ICD-10-CM | POA: Diagnosis not present

## 2021-01-30 DIAGNOSIS — R519 Headache, unspecified: Secondary | ICD-10-CM | POA: Diagnosis not present

## 2021-01-30 DIAGNOSIS — I10 Essential (primary) hypertension: Secondary | ICD-10-CM | POA: Diagnosis not present

## 2021-01-30 DIAGNOSIS — R0683 Snoring: Secondary | ICD-10-CM | POA: Diagnosis not present

## 2021-01-30 DIAGNOSIS — G43009 Migraine without aura, not intractable, without status migrainosus: Secondary | ICD-10-CM | POA: Diagnosis not present

## 2021-01-30 DIAGNOSIS — E782 Mixed hyperlipidemia: Secondary | ICD-10-CM | POA: Diagnosis not present

## 2022-09-25 ENCOUNTER — Other Ambulatory Visit: Payer: Self-pay | Admitting: Physician Assistant

## 2022-09-25 DIAGNOSIS — H9312 Tinnitus, left ear: Secondary | ICD-10-CM

## 2022-09-25 DIAGNOSIS — H903 Sensorineural hearing loss, bilateral: Secondary | ICD-10-CM

## 2022-10-05 ENCOUNTER — Encounter: Payer: Self-pay | Admitting: Physician Assistant

## 2022-10-27 ENCOUNTER — Other Ambulatory Visit: Payer: Self-pay
